# Patient Record
Sex: Female | Born: 1975 | Hispanic: No | Marital: Married | Smoking: Current every day smoker
Health system: Southern US, Community
[De-identification: ages and names within clinical notes are randomized; demographics above are authoritative.]

## PROBLEM LIST (undated history)

## (undated) DIAGNOSIS — L509 Urticaria, unspecified: Secondary | ICD-10-CM

## (undated) HISTORY — PX: APPENDECTOMY: SHX54

## (undated) HISTORY — DX: Urticaria, unspecified: L50.9

## (undated) HISTORY — PX: ENDOMETRIAL ABLATION: SHX621

## (undated) HISTORY — PX: TUBAL LIGATION: SHX77

---

## 2002-10-06 ENCOUNTER — Other Ambulatory Visit: Admission: RE | Admit: 2002-10-06 | Discharge: 2002-10-06 | Payer: Self-pay | Admitting: Obstetrics and Gynecology

## 2013-11-17 ENCOUNTER — Ambulatory Visit (INDEPENDENT_AMBULATORY_CARE_PROVIDER_SITE_OTHER): Payer: BC Managed Care – PPO | Admitting: Podiatry

## 2013-11-17 ENCOUNTER — Ambulatory Visit (INDEPENDENT_AMBULATORY_CARE_PROVIDER_SITE_OTHER): Payer: BC Managed Care – PPO

## 2013-11-17 ENCOUNTER — Encounter: Payer: Self-pay | Admitting: Podiatry

## 2013-11-17 VITALS — BP 115/68 | HR 77 | Resp 16

## 2013-11-17 DIAGNOSIS — M2010 Hallux valgus (acquired), unspecified foot: Secondary | ICD-10-CM

## 2013-11-17 DIAGNOSIS — M779 Enthesopathy, unspecified: Secondary | ICD-10-CM

## 2013-11-17 DIAGNOSIS — M2011 Hallux valgus (acquired), right foot: Secondary | ICD-10-CM

## 2013-11-17 DIAGNOSIS — S93409A Sprain of unspecified ligament of unspecified ankle, initial encounter: Secondary | ICD-10-CM

## 2013-11-17 DIAGNOSIS — L84 Corns and callosities: Secondary | ICD-10-CM

## 2013-11-17 NOTE — Progress Notes (Signed)
Subjective:     Patient ID: Victoria Rios, female   DOB: 04/06/1975, 38 y.o.   MRN: [id number]  HPI patient presents stating I have these terrible calluses on the bottom of right foot and I've also sprain my ankle and it continues to swell I like to get an x-ray of right   Review of Systems     Objective:   Physical Exam Neurovascular found to be intact with a well-healed surgical foot left that we had done surgery on with calluses that completely resolved. Severe callus sub-2 second metatarsal right and on the right hallux with large structural bunion formation plantarflexed metatarsal and deviated right hallux. 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

## 2016-10-17 ENCOUNTER — Encounter (HOSPITAL_BASED_OUTPATIENT_CLINIC_OR_DEPARTMENT_OTHER): Payer: Self-pay | Admitting: Emergency Medicine

## 2016-10-17 ENCOUNTER — Emergency Department (HOSPITAL_BASED_OUTPATIENT_CLINIC_OR_DEPARTMENT_OTHER)
Admission: EM | Admit: 2016-10-17 | Discharge: 2016-10-17 | Disposition: A | Discharge status: Home / Self Care | Payer: BLUE CROSS/BLUE SHIELD | Attending: Emergency Medicine | Admitting: Emergency Medicine

## 2016-10-17 DIAGNOSIS — N946 Dysmenorrhea, unspecified: Secondary | ICD-10-CM

## 2016-10-17 DIAGNOSIS — F17210 Nicotine dependence, cigarettes, uncomplicated: Secondary | ICD-10-CM | POA: Insufficient documentation

## 2016-10-17 DIAGNOSIS — N9489 Other specified conditions associated with female genital organs and menstrual cycle: Secondary | ICD-10-CM | POA: Diagnosis not present

## 2016-10-17 DIAGNOSIS — R102 Pelvic and perineal pain: Secondary | ICD-10-CM | POA: Diagnosis present

## 2016-10-17 LAB — COMPREHENSIVE METABOLIC PANEL
ALT: 28 U/L (ref 14–54)
AST: 23 U/L (ref 15–41)
Albumin: 3.3 g/dL — ABNORMAL LOW (ref 3.5–5.0)
Alkaline Phosphatase: 82 U/L (ref 38–126)
Anion gap: 8 (ref 5–15)
BILIRUBIN TOTAL: 0.2 mg/dL — AB (ref 0.3–1.2)
BUN: 9 mg/dL (ref 6–20)
CO2: 23 mmol/L (ref 22–32)
Calcium: 8.8 mg/dL — ABNORMAL LOW (ref 8.9–10.3)
Chloride: 105 mmol/L (ref 101–111)
Creatinine, Ser: 0.69 mg/dL (ref 0.44–1.00)
Glucose, Bld: 106 mg/dL — ABNORMAL HIGH (ref 65–99)
POTASSIUM: 4 mmol/L (ref 3.5–5.1)
Sodium: 136 mmol/L (ref 135–145)
TOTAL PROTEIN: 6.3 g/dL — AB (ref 6.5–8.1)

## 2016-10-17 LAB — CBC
HEMATOCRIT: 40.2 % (ref 36.0–46.0)
Hemoglobin: 14.2 g/dL (ref 12.0–15.0)
MCH: 32 pg (ref 26.0–34.0)
MCHC: 35.3 g/dL (ref 30.0–36.0)
MCV: 90.5 fL (ref 78.0–100.0)
PLATELETS: 235 10*3/uL (ref 150–400)
RBC: 4.44 MIL/uL (ref 3.87–5.11)
RDW: 12.8 % (ref 11.5–15.5)
WBC: 9.6 10*3/uL (ref 4.0–10.5)

## 2016-10-17 LAB — URINALYSIS, MICROSCOPIC (REFLEX)

## 2016-10-17 LAB — URINALYSIS, ROUTINE W REFLEX MICROSCOPIC
Bilirubin Urine: NEGATIVE
Glucose, UA: NEGATIVE mg/dL
KETONES UR: NEGATIVE mg/dL
LEUKOCYTES UA: NEGATIVE
NITRITE: NEGATIVE
PH: 6.5 (ref 5.0–8.0)
PROTEIN: NEGATIVE mg/dL
Specific Gravity, Urine: 1.019 (ref 1.005–1.030)

## 2016-10-17 LAB — WET PREP, GENITAL
CLUE CELLS WET PREP: NONE SEEN
Sperm: NONE SEEN
TRICH WET PREP: NONE SEEN
Yeast Wet Prep HPF POC: NONE SEEN

## 2016-10-17 LAB — LIPASE, BLOOD: Lipase: 22 U/L (ref 11–51)

## 2016-10-17 LAB — PREGNANCY, URINE: PREG TEST UR: NEGATIVE

## 2016-10-17 MED ORDER — TRAMADOL HCL 50 MG PO TABS: 50.0000 mg | ORAL_TABLET | Freq: Four times a day (QID) | ORAL | 0 refills | Status: AC | PRN

## 2016-10-17 MED ORDER — IBUPROFEN 800 MG PO TABS: 800.0000 mg | ORAL_TABLET | Freq: Three times a day (TID) | ORAL | 0 refills | Status: AC

## 2016-10-17 MED FILL — IBUPROFEN 800 MG TABLET: 800 | 7 days supply | Qty: 21 | Fill #0

## 2016-10-17 MED FILL — traMADol HCL 50 MG TABS: 50 | 3 days supply | Qty: 10 | Fill #0

## 2016-10-17 NOTE — ED Triage Notes (Addendum)
Patient reports currently on menstrual cycle.  States that she has had cramping for 2 days.  States seen at doctor at work and given toradol for the past 2 days with relief for a few hours but then the pain comes back.  Reports she had ablation done 1 year ago due to heavy menstrual cycles.  Reports pain to lower abdomen and groin area. Denies dysuria. Endorses diarrhea and nausea without vomiting

## 2016-10-17 NOTE — ED Provider Notes (Signed)
[facility] Provider Note   CSN: [id number] Arrival date & time: 10/17/16  1010     History   Chief Complaint Chief Complaint  Patient presents with  . Abdominal Pain  . Menstrual Problem    HPI Victoria Rios is a 41 y.o. female.  Patient with history of "uterine scarring" presents with complaint of lower abdominal pain and pelvic pain described as "contractions" starting 3 days ago. Patient is on her menstrual period. She has had painful periods in the past, however it is unusual for them to last this long. Bleeding is heavier than normal. She was given toradol by a doctor at her job the past 2 days with temporary relief. She took ibuprofen this morning which did not help. She presents to the emergency department for further evaluation. She has a follow-up with her OB/GYN next month. No associated fevers, vomiting. Patient did have an episode of diarrhea this morning, nonbloody, and states that she is worried about diverticulitis. She does not have a history of this but has a family history. Pain has moved up her left upper quadrant today. The onset of this condition was acute. The course is constant. Aggravating factors: none. Alleviating factors: none.        History reviewed. No pertinent past medical history.  There are no active problems to display for this patient.   Past Surgical History:  Procedure Laterality Date  . APPENDECTOMY    . ENDOMETRIAL ABLATION    . TUBAL LIGATION      OB History    No data available       Home Medications    Prior to Admission medications   Not on File    Family History History reviewed. No pertinent family history.  Social History Social History  Substance Use Topics  . Smoking status: Current Every Day Smoker    Packs/day: 1.00    Types: Cigarettes  . Smokeless tobacco: Never Used  . Alcohol use No     Allergies   Patient has no known allergies.   Review of Systems Review of Systems    Constitutional: Negative for fever.  HENT: Negative for rhinorrhea and sore throat.   Eyes: Negative for redness.  Respiratory: Negative for cough.   Cardiovascular: Negative for chest pain.  Gastrointestinal: Positive for abdominal pain and diarrhea. Negative for nausea and vomiting.  Genitourinary: Positive for menstrual problem, pelvic pain and vaginal bleeding. Negative for dysuria.  Musculoskeletal: Negative for myalgias.  Skin: Negative for rash.  Neurological: Negative for headaches.     Physical Exam Updated Vital Signs BP (!) 109/57 (BP Location: Left Arm)   Pulse 70   Temp 98.6 F (37 C) (Oral)   Resp 20   Ht 5\' 3"  (1.6 m)   Wt 113.4 kg (250 lb)   LMP 10/17/2016 (Exact Date)   SpO2 100%   BMI 44.29 kg/m   Physical Exam  Constitutional: She appears well-developed and well-nourished.  HENT:  Head: Normocephalic and atraumatic.  Eyes: Conjunctivae are normal. Right eye exhibits no discharge. Left eye exhibits no discharge.  Neck: Normal range of motion. Neck supple.  Cardiovascular: Normal rate, regular rhythm and normal heart sounds.   No murmur heard. Pulmonary/Chest: Effort normal and breath sounds normal. No respiratory distress. She has no wheezes. She has no rales.  Abdominal: Soft. There is tenderness (Lower abdominal, left upper quadrant). There is no rebound and no guarding.  Genitourinary: Pelvic exam was performed with patient supine. There is no rash  or tenderness on the right labia. There is no rash or tenderness on the left labia. Uterus is tender. Cervix exhibits no motion tenderness. Right adnexum displays no mass and no tenderness. Left adnexum displays no mass and no tenderness. There is bleeding (from os) in the vagina. No tenderness in the vagina. No vaginal discharge found.  Neurological: She is alert.  Skin: Skin is warm and dry.  Psychiatric: She has a normal mood and affect.  Nursing note and vitals reviewed.    ED Treatments / Results   Labs (all labs ordered are listed, but only abnormal results are displayed) Labs Reviewed  WET PREP, GENITAL - Abnormal; Notable for the following:       Result Value   WBC, Wet Prep HPF POC MANY (*)    All other components within normal limits  COMPREHENSIVE METABOLIC PANEL - Abnormal; Notable for the following:    Glucose, Bld 106 (*)    Calcium 8.8 (*)    Total Protein 6.3 (*)    Albumin 3.3 (*)    Total Bilirubin 0.2 (*)    All other components within normal limits  URINALYSIS, ROUTINE W REFLEX MICROSCOPIC - Abnormal; Notable for the following:    Hgb urine dipstick SMALL (*)    All other components within normal limits  URINALYSIS, MICROSCOPIC (REFLEX) - Abnormal; Notable for the following:    Bacteria, UA RARE (*)    Squamous Epithelial / LPF 0-5 (*)    All other components within normal limits  LIPASE, BLOOD  CBC  PREGNANCY, URINE  GC/CHLAMYDIA PROBE AMP ([place]) NOT [facility]    EKG  EKG Interpretation None       Radiology No results found.  Procedures Procedures (including critical care time)  Medications Ordered in ED Medications - No data to display   Initial Impression / Assessment and Plan / ED Course  I have reviewed the triage vital signs and the nursing notes.  Pertinent labs & imaging results that were available during my care of the patient were reviewed by me and considered in my medical decision making (see chart for details).     Patient seen and examined. Work-up initiated.   Will perform pelvic exam. Pain is more consistent with menstruation than diverticulitis.  Vital signs reviewed and are as follows: BP (!) 109/57 (BP Location: Left Arm)   Pulse 70   Temp 98.6 F (37 C) (Oral)   Resp 20   Ht 5\' 3"  (1.6 m)   Wt 113.4 kg (250 lb)   LMP 10/17/2016 (Exact Date)   SpO2 100%   BMI 44.29 kg/m   12:37 PM pelvic exam performed with RN chaperone (Joss). Patient has midline tenderness only. We discussed and I offered pelvic  ultrasound versus close follow-up with OB/GYN. Patient would like to follow-up with her OB/GYN as planned. Discussed that I have low suspicion for ovarian cyst or tubo-ovarian abscess. She may have endometriosis or more likely uterine fibroids. Will discharge to home with small amount of tramadol to use for breakthrough pain. Patient is actually feeling better now after taking ibuprofen just prior to arrival.  Patient counseled on use of narcotic pain medications. Counseled not to combine these medications with others containing tylenol. Urged not to drink alcohol, drive, or perform any other activities that requires focus while taking these medications. The patient verbalizes understanding and agrees with the plan.   Final Clinical Impressions(s) / ED Diagnoses   Final diagnoses:  Painful menstruation   Patient  with painful menstruation, ongoing for several months, worse in prolonged this month. Patient has also had some left-sided abdominal pain and diarrhea 1. Lab work is reassuring with elevated white blood cell count. Pelvic exam shows non-heavy bleeding with some mild uterine tenderness consistent with her menstruation. No adnexal tenderness. No significant focal tenderness on abdominal exam. Patient has appropriate OB/GYN follow-up. Given the reassuring lab work and exam, do not feel that CT imaging is indicated at this time.  New Prescriptions New Prescriptions   IBUPROFEN (ADVIL,MOTRIN) 800 MG TABLET    Take 1 tablet (800 mg total) by mouth 3 (three) times daily.   TRAMADOL (ULTRAM) 50 MG TABLET    Take 1 tablet (50 mg total) by mouth every 6 (six) hours as needed.     Renne Crigler, PA-C 10/17/16 1239    Rolland Porter, MD 10/19/16 [phone]

## 2016-10-17 NOTE — Discharge Instructions (Signed)
Please read and follow all provided instructions.  Your diagnoses today include:  1. Painful menstruation     Tests performed today include:  Blood counts and electrolytes - normal  Urine test - normal  Wet prep - no vaginal infection  Vital signs. See below for your results today.   Medications prescribed:   Tramadol - narcotic-like pain medication  DO NOT drive or perform any activities that require you to be awake and alert because this medicine can make you drowsy.    Ibuprofen (Motrin, Advil) - anti-inflammatory pain medication  Do not exceed 600mg  ibuprofen every 6 hours, take with food  You have been prescribed an anti-inflammatory medication or NSAID. Take with food. Take smallest effective dose for the shortest duration needed for your pain. Stop taking if you experience stomach pain or vomiting.   Take any prescribed medications only as directed.  Home care instructions:  Follow any educational materials contained in this packet.  BE VERY CAREFUL not to take multiple medicines containing Tylenol (also called acetaminophen). Doing so can lead to an overdose which can damage your liver and cause liver failure and possibly death.   Follow-up instructions: Please follow-up with your GYN as planned for further evaluation of your symptoms.   Return instructions:   Please return to the Emergency Department if you experience worsening symptoms.   Return with fever, worsening abdominal pain, blood in the stool.  Please return if you have any other emergent concerns.  Additional Information:  Your vital signs today were: BP (!) 109/57 (BP Location: Left Arm)    Pulse 70    Temp 98.6 F (37 C) (Oral)    Resp 20    Ht 5\' 3"  (1.6 m)    Wt 113.4 kg (250 lb)    LMP 10/17/2016 (Exact Date)    SpO2 100%    BMI 44.29 kg/m  If your blood pressure (BP) was elevated above 135/85 this visit, please have this repeated by your doctor within one month. --------------

## 2016-10-20 ENCOUNTER — Encounter: Payer: Self-pay | Admitting: Gastroenterology

## 2016-10-20 LAB — GC/CHLAMYDIA PROBE AMP (~~LOC~~) NOT AT ARMC
Chlamydia: NEGATIVE
Neisseria Gonorrhea: NEGATIVE

## 2016-11-28 ENCOUNTER — Ambulatory Visit: Payer: Self-pay | Admitting: Gastroenterology

## 2017-01-23 ENCOUNTER — Ambulatory Visit: Payer: Self-pay | Admitting: Gastroenterology

## 2018-08-20 ENCOUNTER — Encounter: Payer: Self-pay | Admitting: Allergy

## 2018-08-20 ENCOUNTER — Other Ambulatory Visit: Payer: Self-pay

## 2018-08-20 ENCOUNTER — Ambulatory Visit (INDEPENDENT_AMBULATORY_CARE_PROVIDER_SITE_OTHER): Payer: BLUE CROSS/BLUE SHIELD | Admitting: Allergy

## 2018-08-20 VITALS — BP 118/78 | HR 75 | Temp 97.9°F | Resp 16 | Ht 63.0 in | Wt 262.2 lb

## 2018-08-20 DIAGNOSIS — L503 Dermatographic urticaria: Secondary | ICD-10-CM | POA: Diagnosis not present

## 2018-08-20 DIAGNOSIS — L509 Urticaria, unspecified: Secondary | ICD-10-CM

## 2018-08-20 MED ORDER — FAMOTIDINE 20 MG PO TABS: 20.0000 mg | ORAL_TABLET | Freq: Two times a day (BID) | ORAL | 5 refills | Status: AC

## 2018-08-20 MED ORDER — MONTELUKAST SODIUM 10 MG PO TABS: 10.0000 mg | ORAL_TABLET | Freq: Every day | ORAL | 5 refills | Status: AC

## 2018-08-20 MED ORDER — FEXOFENADINE HCL 180 MG PO TABS: 180.0000 mg | ORAL_TABLET | Freq: Two times a day (BID) | ORAL | 5 refills | Status: AC

## 2018-08-20 NOTE — Patient Instructions (Signed)
Hives   - at this time etiology of hives is unknown.  Hives can be caused by a variety of different triggers including illness/infection, foods, medications, stings, exercise, pressure, vibrations, extremes of temperature to name a few however majority of the time there is no identifiable trigger.  Will obtain labwork to evaluate: CBC w diff, CMP, tryptase, hive panel, environmental panel, alpha-gal panel, inflammatory markers  - for management of hives recommend starting the following regimen:  Allegra 180mg  1 tab twice a day with Pepcid 20mg  1 tab twice a day.  Also recommend starting Singulair 10mg  daily at bedtime  - if the above high-dose antihistamine regimen is not effective in controlling hives then recommend starting Xolair monthly injections that was briefly discussed today.    - let us know if you have any fevers or swelling with your hives.    Follow-up 2 months or sooner if needed

## 2018-08-20 NOTE — Progress Notes (Signed)
New Patient Note  RE: Victoria Rios MRN: [id number] DOB: 04/11/1975 Date of Office Visit: 08/20/2018  Referring provider: Ailene Ravel, MD Primary care provider: Ailene Ravel, MD  Chief Complaint: hives  History of present illness: Victoria Rios is a 42 y.o. female presenting today for consultation for hives.   She states hives started about 3 weeks ago.  She has never had hives before.  They are appearing on her arms, breast, legs, top of feet.  Hives are very itchy.  She provided with pictures which are consistent with urticaria.  She states the hive go away shortly after using atarax.  She states has been taking Atarax every 6 hours until she started receiving kenalog injections from her PCP.  She has received 2 kenalog injections a week a part with last injection on Tuesday of this week.  She states with kenalog that the hives have improved but last week they returned after about 4-5 days prompting getting a second injection.  With the hives she does report mild joint aches yesterday.   She did have an area on her thigh that did bruise after hives resolved.  She denies having any fevers or swelling.  She denies any preceding illnesses, no new foods, no change in medication or new medications, no stings, no change in soaps/lotions/detergents.    She does report seasonal allergy symptoms with nasal congestion/drainage primarily.  She may take an OTC antihistamine as needed but normally states she can deal with the symptoms.    No history of eczema, asthma or food allergy.  She does take prevacid for reflux control.    Review of systems: Review of Systems  Constitutional: Negative for chills, fever and malaise/fatigue.  HENT: Negative for congestion, ear discharge, nosebleeds and sore throat.   Eyes: Negative for pain, discharge and redness.  Respiratory: Negative for cough, shortness of breath and wheezing.   Cardiovascular: Negative for chest pain.  Gastrointestinal: Negative  for abdominal pain, constipation, diarrhea, heartburn, nausea and vomiting.  Musculoskeletal: Negative for joint pain.  Skin: Positive for itching and rash.  Neurological: Negative for headaches.    All other systems negative unless noted above in HPI  Past medical history: Past Medical History:  Diagnosis Date  . Urticaria     Past surgical history: Past Surgical History:  Procedure Laterality Date  . APPENDECTOMY    . ENDOMETRIAL ABLATION    . TUBAL LIGATION      Family history:  Family History  Problem Relation Age of Onset  . COPD Mother   . COPD Father   . Eczema Sister   . Lupus Maternal Aunt   . Allergic rhinitis Neg Hx   . Angioedema Neg Hx   . Asthma Neg Hx   . Atopy Neg Hx   . Immunodeficiency Neg Hx   . Urticaria Neg Hx     Social history: Lives in a home with carpeting with electric heating and central cooling.  Cat and dog in the home.  No concern for water damage, mildew or roaches.  She works in data entry.   Tobacco Use  . Smoking status: Current Every Day Smoker    Packs/day: 1.00    Types: Cigarettes  . Smokeless tobacco: Never Used  . Tobacco comment: smokes outside the house    Medication List: Allergies as of 08/20/2018   No Known Allergies     Medication List       Accurate as of Aug 20, 2018 12:03 PM.  If you have any questions, ask your nurse or doctor.        hydrOXYzine 25 MG tablet Commonly known as:  ATARAX/VISTARIL TK 1 T PO Q 6 H PRN ITCHING   ibuprofen 800 MG tablet Commonly known as:  ADVIL Take 1 tablet (800 mg total) by mouth 3 (three) times daily.   traMADol 50 MG tablet Commonly known as:  ULTRAM Take 1 tablet (50 mg total) by mouth every 6 (six) hours as needed.       Known medication allergies: No Known Allergies   Physical examination: Blood pressure 118/78, pulse 75, temperature 97.9 F (36.6 C), temperature source Temporal, resp. rate 16, height 5\' 3"  (1.6 m), weight 262 lb 3.2 oz (118.9 kg), SpO2  97 %.  General: Alert, interactive, in no acute distress. HEENT: PERRLA, TMs pearly gray, turbinates minimally edematous without discharge, post-pharynx non erythematous. Neck: Supple without lymphadenopathy. Lungs: Clear to auscultation without wheezing, rhonchi or rales. {no increased work of breathing. CV: Normal S1, S2 without murmurs. Abdomen: Nondistended, nontender. Skin: Warm and dry, without lesions or rashes. Extremities:  No clubbing, cyanosis or edema. Neuro:   Grossly intact.  Diagnositics/Labs: None today  Assessment and plan:   Hives   - at this time etiology of hives is unknown.  Hives can be caused by a variety of different triggers including illness/infection, foods, medications, stings, exercise, pressure, vibrations, extremes of temperature to name a few however majority of the time there is no identifiable trigger.  Will obtain labwork to evaluate: CBC w diff, CMP, tryptase, hive panel, environmental panel, alpha-gal panel, inflammatory markers  - for management of hives recommend starting the following regimen:  Allegra 180mg  1 tab twice a day with Pepcid 20mg  1 tab twice a day.  Also recommend starting Singulair 10mg  daily at bedtime  - if the above high-dose antihistamine regimen is not effective in controlling hives then recommend starting Xolair monthly injections that was briefly discussed today.    - let us know if you have any fevers or swelling with your hives.    Follow-up 2 months or sooner if needed  I appreciate the opportunity to take part in Victoria Rios's care. Please do not hesitate to contact me with questions.  Sincerely,   Margo AyeShaylar Jernard Reiber, MD Allergy/Immunology [facility]

## 2018-08-28 LAB — COMPREHENSIVE METABOLIC PANEL
ALT: 12 IU/L (ref 0–32)
AST: 9 IU/L (ref 0–40)
Albumin/Globulin Ratio: 1.8 (ref 1.2–2.2)
Albumin: 4.2 g/dL (ref 3.8–4.8)
Alkaline Phosphatase: 116 IU/L (ref 39–117)
BUN/Creatinine Ratio: 16 (ref 9–23)
BUN: 11 mg/dL (ref 6–24)
Bilirubin Total: 0.4 mg/dL (ref 0.0–1.2)
CO2: 20 mmol/L (ref 20–29)
Calcium: 9.4 mg/dL (ref 8.7–10.2)
Chloride: 100 mmol/L (ref 96–106)
Creatinine, Ser: 0.67 mg/dL (ref 0.57–1.00)
GFR calc Af Amer: 127 mL/min/{1.73_m2} (ref >59)
GFR calc non Af Amer: 110 mL/min/{1.73_m2} (ref >59)
Globulin, Total: 2.4 g/dL (ref 1.5–4.5)
Glucose: 78 mg/dL (ref 65–99)
Potassium: 4.4 mmol/L (ref 3.5–5.2)
Sodium: 137 mmol/L (ref 134–144)
Total Protein: 6.6 g/dL (ref 6.0–8.5)

## 2018-08-28 LAB — ALPHA-GAL PANEL
Alpha Gal IgE*: <0.1 kU/L (ref <0.10)
Beef (Bos spp) IgE: <0.1 kU/L (ref <0.35)
Class Interpretation: 0
Class Interpretation: 0
Class Interpretation: 0
Lamb/Mutton (Ovis spp) IgE: <0.1 kU/L (ref <0.35)
Pork (Sus spp) IgE: <0.1 kU/L (ref <0.35)

## 2018-08-28 LAB — IGE+ALLERGENS ZONE 2(30)
Alternaria Alternata IgE: <0.1 kU/L
Amer Sycamore IgE Qn: 0.47 kU/L — AB
Aspergillus Fumigatus IgE: <0.1 kU/L
Bahia Grass IgE: 0.17 kU/L — AB
Bermuda Grass IgE: 0.22 kU/L — AB
Cat Dander IgE: <0.1 kU/L
Cedar, Mountain IgE: 0.1 kU/L — AB
Cladosporium Herbarum IgE: <0.1 kU/L
Cockroach, American IgE: <0.1 kU/L
Common Silver Birch IgE: <0.1 kU/L
D Farinae IgE: 0.24 kU/L — AB
D Pteronyssinus IgE: 0.25 kU/L — AB
Dog Dander IgE: <0.1 kU/L
Elm, American IgE: 0.15 kU/L — AB
Hickory, White IgE: 0.11 kU/L — AB
IgE (Immunoglobulin E), Serum: 167 IU/mL (ref 6–495)
Johnson Grass IgE: 0.2 kU/L — AB
Maple/Box Elder IgE: 0.17 kU/L — AB
Mucor Racemosus IgE: <0.1 kU/L
Mugwort IgE Qn: <0.1 kU/L
Nettle IgE: <0.1 kU/L
Oak, White IgE: 0.14 kU/L — AB
Penicillium Chrysogen IgE: <0.1 kU/L
Pigweed, Rough IgE: <0.1 kU/L
Plantain, English IgE: 0.2 kU/L — AB
Ragweed, Short IgE: 0.19 kU/L — AB
Sheep Sorrel IgE Qn: 0.31 kU/L — AB
Stemphylium Herbarum IgE: <0.1 kU/L
Sweet gum IgE RAST Ql: 0.11 kU/L — AB
Timothy Grass IgE: 0.16 kU/L — AB
White Mulberry IgE: <0.1 kU/L

## 2018-08-28 LAB — CBC WITH DIFFERENTIAL/PLATELET
Basophils Absolute: 0.1 10*3/uL (ref 0.0–0.2)
Basos: 1 %
EOS (ABSOLUTE): 0.3 10*3/uL (ref 0.0–0.4)
Eos: 3 %
Hematocrit: 47.9 % — ABNORMAL HIGH (ref 34.0–46.6)
Hemoglobin: 16.1 g/dL — ABNORMAL HIGH (ref 11.1–15.9)
Immature Grans (Abs): 0 10*3/uL (ref 0.0–0.1)
Immature Granulocytes: 0 %
Lymphocytes Absolute: 2.5 10*3/uL (ref 0.7–3.1)
Lymphs: 26 %
MCH: 31.5 pg (ref 26.6–33.0)
MCHC: 33.6 g/dL (ref 31.5–35.7)
MCV: 94 fL (ref 79–97)
Monocytes Absolute: 0.6 10*3/uL (ref 0.1–0.9)
Monocytes: 6 %
Neutrophils Absolute: 6.1 10*3/uL (ref 1.4–7.0)
Neutrophils: 64 %
Platelets: 337 10*3/uL (ref 150–450)
RBC: 5.11 x10E6/uL (ref 3.77–5.28)
RDW: 13.1 % (ref 11.7–15.4)
WBC: 9.7 10*3/uL (ref 3.4–10.8)

## 2018-08-28 LAB — THYROID ANTIBODIES
Thyroglobulin Antibody: <1 IU/mL (ref 0.0–0.9)
Thyroperoxidase Ab SerPl-aCnc: 15 IU/mL (ref 0–34)

## 2018-08-28 LAB — CHRONIC URTICARIA: cu index: <1 (ref <10)

## 2018-08-28 LAB — TRYPTASE: Tryptase: 8.9 ug/L (ref 2.2–13.2)

## 2018-08-28 LAB — ANA W/REFLEX: Anti Nuclear Antibody (ANA): NEGATIVE

## 2018-08-28 LAB — SEDIMENTATION RATE: Sed Rate: 53 mm/hr — ABNORMAL HIGH (ref 0–32)

## 2018-09-02 ENCOUNTER — Other Ambulatory Visit: Payer: Self-pay | Admitting: Family Medicine

## 2018-09-02 ENCOUNTER — Ambulatory Visit
Admission: RE | Admit: 2018-09-02 | Discharge: 2018-09-02 | Disposition: A | Discharge status: Home / Self Care | Payer: Self-pay | Source: Ambulatory Visit | Attending: Family Medicine | Admitting: Family Medicine

## 2018-09-02 ENCOUNTER — Other Ambulatory Visit: Payer: Self-pay

## 2018-09-02 ENCOUNTER — Telehealth: Payer: Self-pay | Admitting: Allergy

## 2018-09-02 DIAGNOSIS — M79672 Pain in left foot: Secondary | ICD-10-CM

## 2018-09-02 NOTE — Telephone Encounter (Signed)
[facility] is calling for the lab results and office notes from 5/22 where pt had elevated cbc lab results and was referred to them. Please fax to [phone] Maxine Glenn

## 2018-09-14 NOTE — Telephone Encounter (Signed)
Since nicole is out for now can you take care of this please and thank you

## 2018-09-17 NOTE — Telephone Encounter (Signed)
Victoria Rios, you may have done this already. I just noticed it was sent to you 2 weeks ago.

## 2018-09-17 NOTE — Telephone Encounter (Signed)
I have taken care of this on June 8th.

## 2018-11-12 ENCOUNTER — Ambulatory Visit: Payer: BLUE CROSS/BLUE SHIELD | Admitting: Allergy

## 2019-05-05 ENCOUNTER — Other Ambulatory Visit: Payer: Self-pay | Admitting: Obstetrics and Gynecology

## 2019-05-05 DIAGNOSIS — Z1231 Encounter for screening mammogram for malignant neoplasm of breast: Secondary | ICD-10-CM

## 2019-06-08 ENCOUNTER — Other Ambulatory Visit: Payer: Self-pay | Admitting: Obstetrics and Gynecology

## 2019-06-08 DIAGNOSIS — R102 Pelvic and perineal pain: Secondary | ICD-10-CM

## 2019-06-08 DIAGNOSIS — R1032 Left lower quadrant pain: Secondary | ICD-10-CM

## 2020-05-17 IMAGING — CR LEFT FOOT - COMPLETE 3+ VIEW
3 series · 3 of 3 positions shown · non-contrast
Comparison: None

CLINICAL DATA: LEFT foot pain

EXAM:
LEFT FOOT - COMPLETE 3+ VIEW

[x foot ap left]
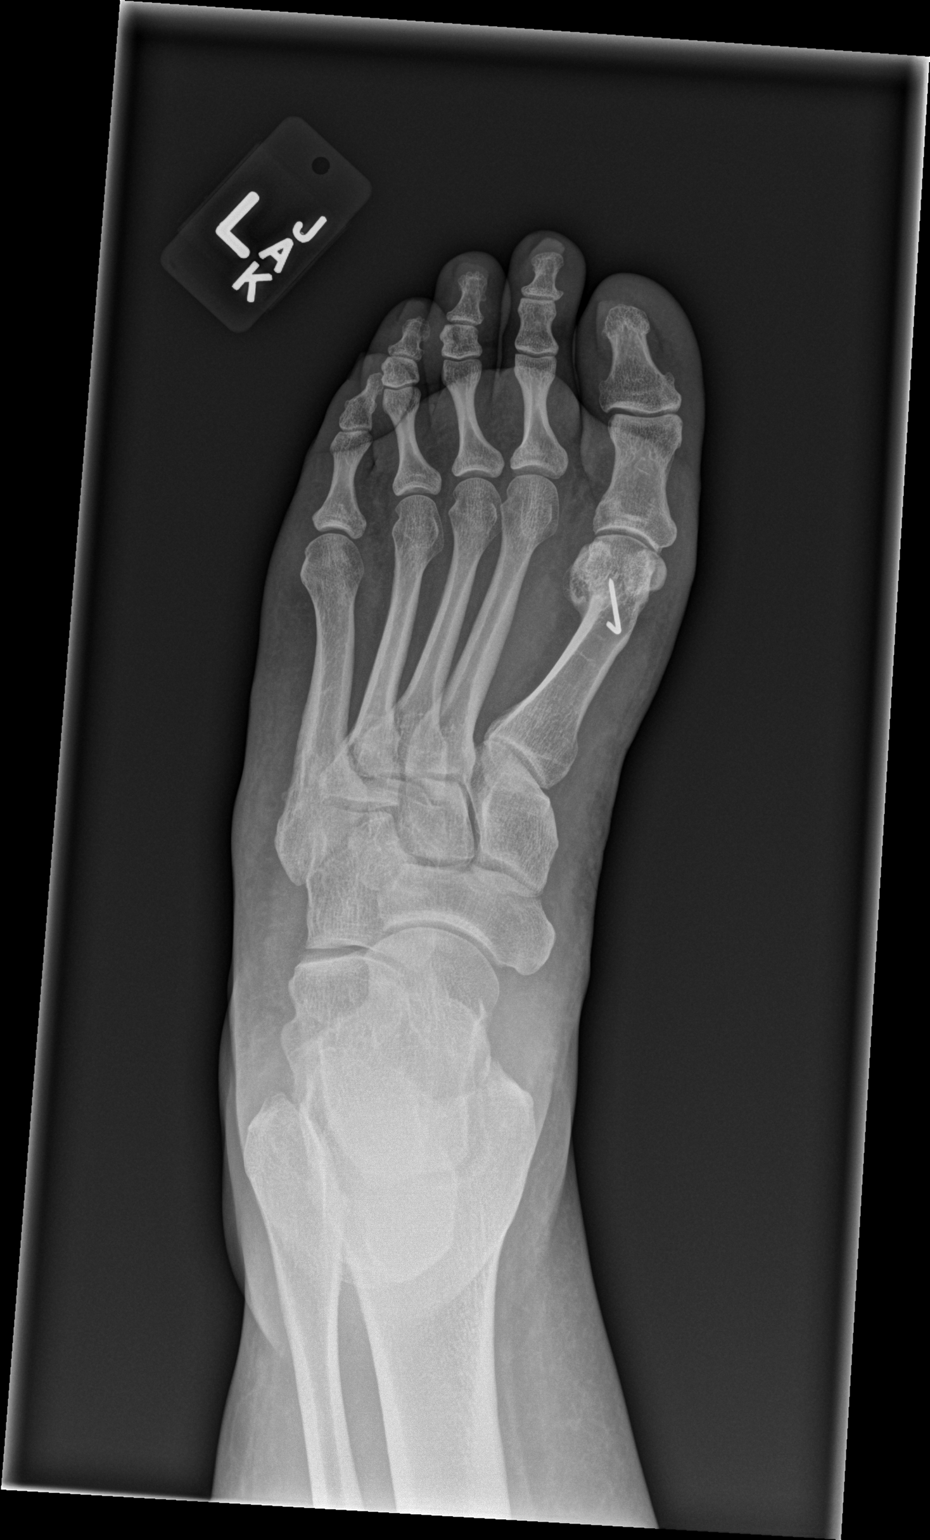

[x foot obl left]
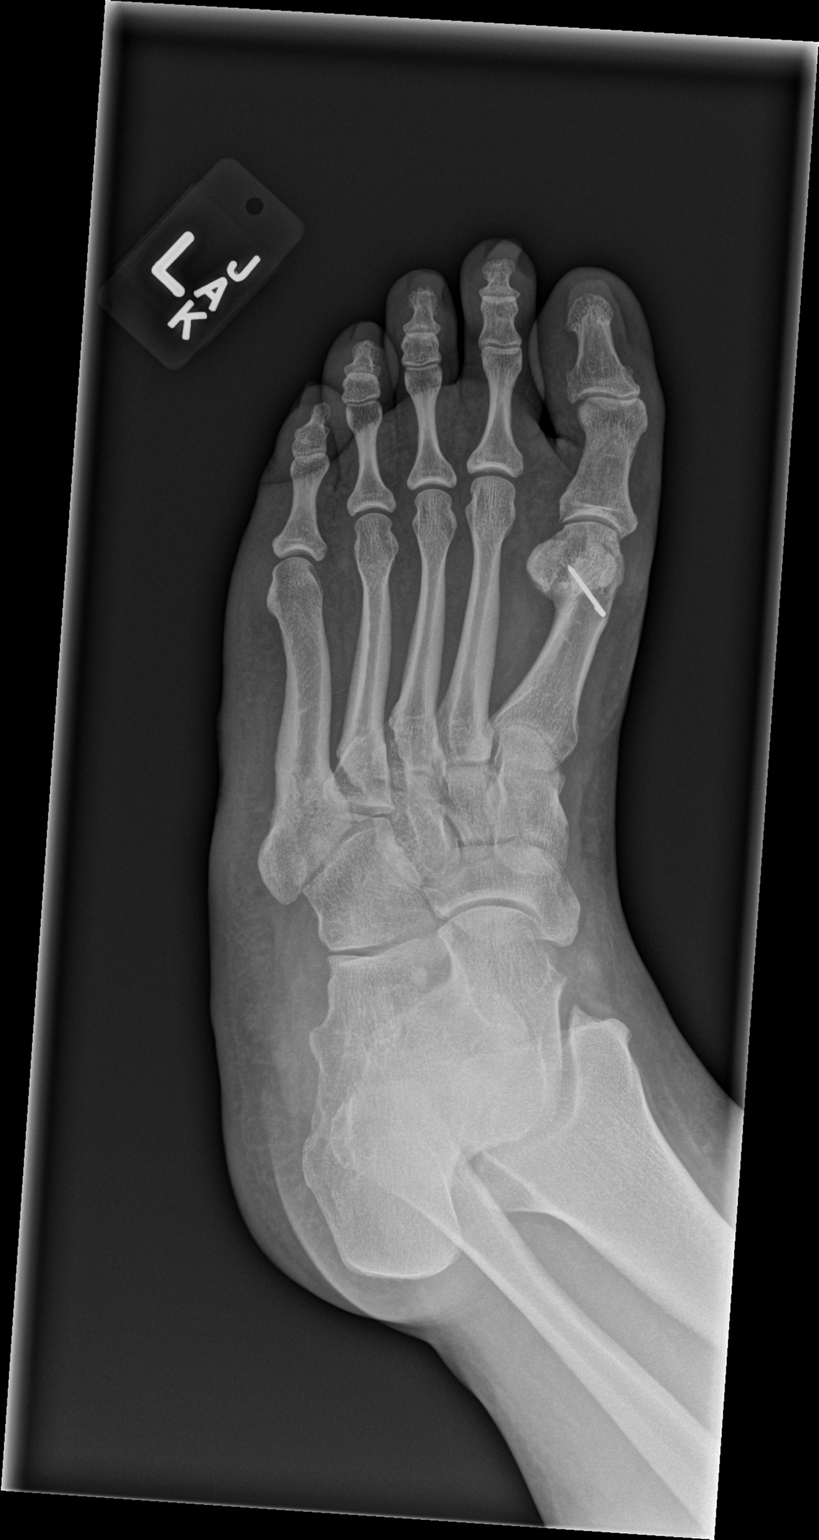

[x foot lat left]
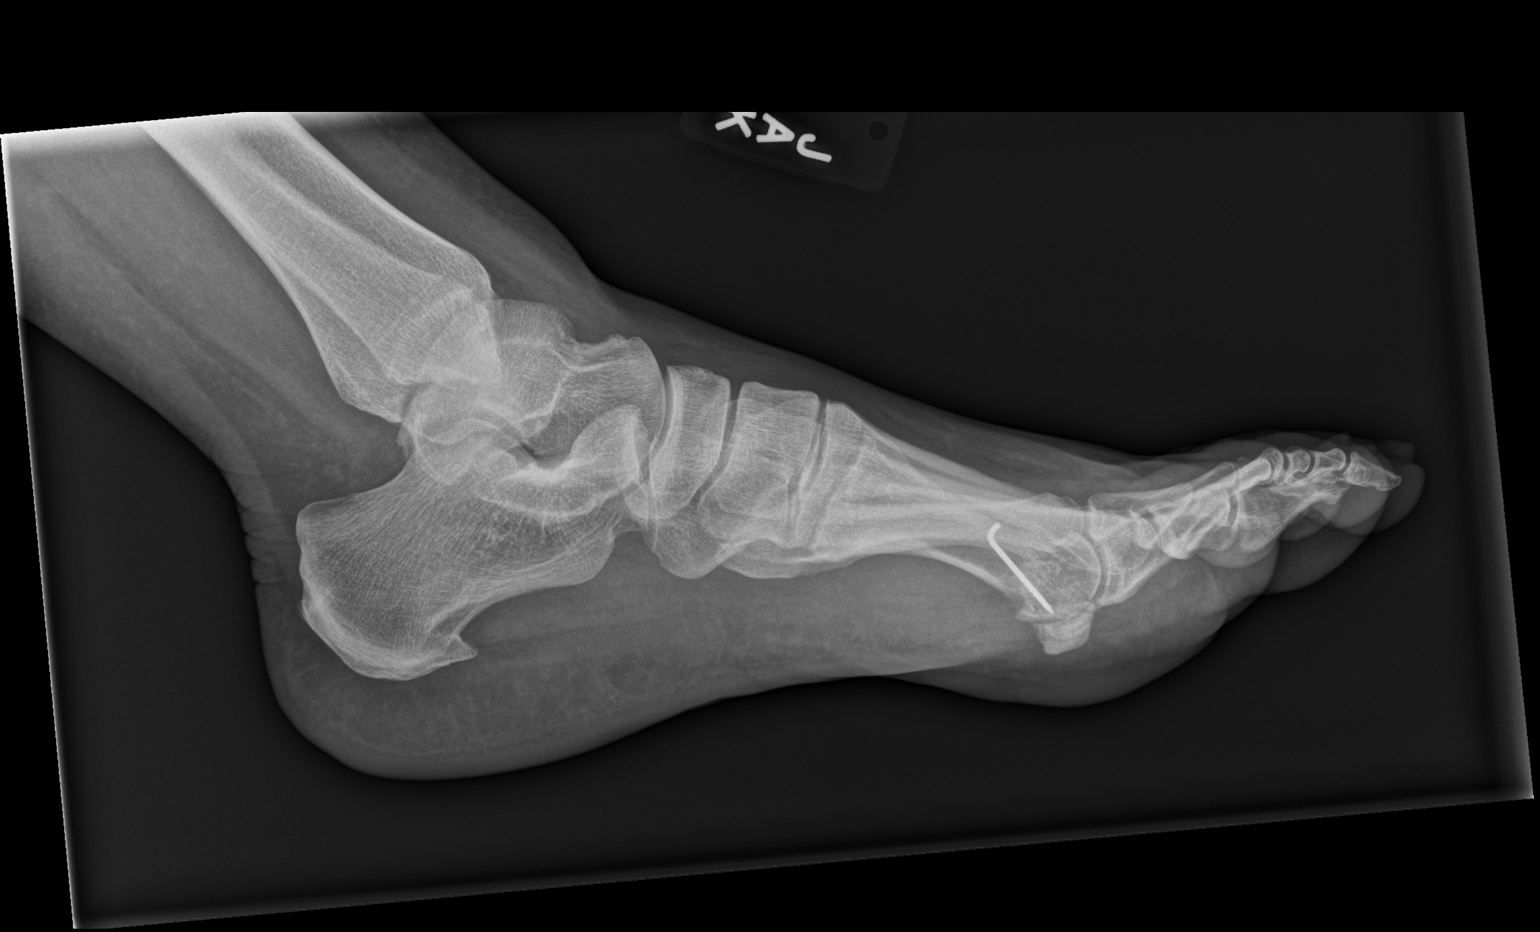

[3 of 3 positions shown; findings below may reference images not displayed]

FINDINGS: Prior osteotomy and bunionectomy at distal first metatarsal with
small K-wire.

Osseous mineralization normal.

Minimal degenerative changes first MTP joint.

Joint spaces otherwise preserved.

No acute fracture, dislocation, or bone destruction.

Small plantar calcaneal spur
IMPRESSION: Postsurgical changes of the distal first metatarsal with minimal
degenerative changes of the first MTP joint.

No acute abnormalities.

## 2020-08-23 ENCOUNTER — Other Ambulatory Visit: Payer: Self-pay | Admitting: Obstetrics and Gynecology

## 2020-08-23 DIAGNOSIS — N8000 Endometriosis of the uterus, unspecified: Secondary | ICD-10-CM

## 2020-08-23 DIAGNOSIS — N945 Secondary dysmenorrhea: Secondary | ICD-10-CM

## 2020-08-23 DIAGNOSIS — N8003 Adenomyosis of the uterus: Secondary | ICD-10-CM
